# Patient Record
Sex: Female | Born: 1990 | Race: Black or African American | Hispanic: No | Marital: Single | Smoking: Never smoker
Health system: Southern US, Community
[De-identification: ages and names within clinical notes are randomized; demographics above are authoritative.]

## PROBLEM LIST (undated history)

## (undated) DIAGNOSIS — A749 Chlamydial infection, unspecified: Secondary | ICD-10-CM

---

## 2012-06-27 ENCOUNTER — Encounter (HOSPITAL_COMMUNITY): Payer: Self-pay

## 2012-06-27 ENCOUNTER — Inpatient Hospital Stay (HOSPITAL_COMMUNITY)
Admission: AD | Admit: 2012-06-27 | Discharge: 2012-06-27 | Disposition: A | Discharge status: Home / Self Care | Payer: BC Managed Care – PPO | Source: Ambulatory Visit | Attending: Obstetrics & Gynecology | Admitting: Obstetrics & Gynecology

## 2012-06-27 ENCOUNTER — Inpatient Hospital Stay (HOSPITAL_COMMUNITY): Payer: BC Managed Care – PPO

## 2012-06-27 DIAGNOSIS — N938 Other specified abnormal uterine and vaginal bleeding: Secondary | ICD-10-CM | POA: Insufficient documentation

## 2012-06-27 DIAGNOSIS — N939 Abnormal uterine and vaginal bleeding, unspecified: Secondary | ICD-10-CM

## 2012-06-27 DIAGNOSIS — R109 Unspecified abdominal pain: Secondary | ICD-10-CM | POA: Insufficient documentation

## 2012-06-27 DIAGNOSIS — N898 Other specified noninflammatory disorders of vagina: Secondary | ICD-10-CM

## 2012-06-27 DIAGNOSIS — N949 Unspecified condition associated with female genital organs and menstrual cycle: Secondary | ICD-10-CM | POA: Insufficient documentation

## 2012-06-27 HISTORY — DX: Chlamydial infection, unspecified: A74.9

## 2012-06-27 LAB — URINALYSIS, ROUTINE W REFLEX MICROSCOPIC
Bilirubin Urine: NEGATIVE
Nitrite: NEGATIVE
Specific Gravity, Urine: 1.02 (ref 1.005–1.030)
Urobilinogen, UA: 0.2 mg/dL (ref 0.0–1.0)
pH: 8 (ref 5.0–8.0)

## 2012-06-27 LAB — POCT PREGNANCY, URINE: Preg Test, Ur: NEGATIVE

## 2012-06-27 LAB — WET PREP, GENITAL
Trich, Wet Prep: NONE SEEN
Yeast Wet Prep HPF POC: NONE SEEN

## 2012-06-27 LAB — CBC WITH DIFFERENTIAL/PLATELET
Basophils Absolute: 0 10*3/uL (ref 0.0–0.1)
Basophils Relative: 0 % (ref 0–1)
Eosinophils Absolute: 0.2 10*3/uL (ref 0.0–0.7)
Eosinophils Relative: 3 % (ref 0–5)
HCT: 37 % (ref 36.0–46.0)
Lymphocytes Relative: 31 % (ref 12–46)
MCHC: 31.9 g/dL (ref 30.0–36.0)
MCV: 87.9 fL (ref 78.0–100.0)
Monocytes Absolute: 0.5 10*3/uL (ref 0.1–1.0)
Platelets: 185 10*3/uL (ref 150–400)
RDW: 12.6 % (ref 11.5–15.5)
WBC: 7.5 10*3/uL (ref 4.0–10.5)

## 2012-06-27 LAB — URINE MICROSCOPIC-ADD ON

## 2012-06-27 MED ORDER — NORGESTIMATE-ETH ESTRADIOL 0.25-35 MG-MCG PO TABS: 1.0000 | ORAL_TABLET | Freq: Every day | ORAL | Status: AC

## 2012-06-27 NOTE — MAU Note (Signed)
Patient is in with c/o radiating abdominal pain that started yesterday, she states that she started having light spotting after her PE class. She states that the bleeding is scant. She c/o heartburn and nausea. Her lmp 10/23. She states that it was not a normal cycle.

## 2012-06-27 NOTE — MAU Provider Note (Signed)
History     CSN: [id number]  Arrival date and time: 06/27/12 1556   None     Chief Complaint  Patient presents with  . Abdominal Pain  . Vaginal Bleeding   HPI Valerie Doyle is a 21 y.o. female who presents to MAU with vaginal bleeding. The bleeding started yesterday as spotting and has increased today.  Associated symptoms include abdominal pain. She denies fever or chills. Last sexual intercourse a few days ago. Current sex partner x 4 years, no birth control. Unprotected sex. Hx of Chlamydia 2 years ago.LMP 05/29/12. Has been having 2 periods a month for the past few months. Last pap smear less than one year with her GYN at home and was normal. 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  Patient concerned that she may have fibroids because her mother had to have a hysterectomy due to fibroids.  OB History    Grav Para Term Preterm Abortions TAB SAB Ect Mult Living   1               Past Medical History  Diagnosis Date  . Chlamydia     History reviewed. No pertinent past surgical history.  History reviewed. No pertinent family history.  History  Substance Use Topics  . Smoking status: Never Smoker   . Smokeless tobacco: Not on file  . Alcohol Use: No    Allergies: No Known Allergies  No prescriptions prior to admission    ROS: as stated in HPI Blood pressure 113/68, pulse 68, temperature 98.7 F (37.1 C), temperature source Oral, resp. rate 16, height 5\' 3"  (1.6 m), weight 123 lb 8 oz (56.019 kg), last menstrual period 05/29/2012.  Physical Exam  Nursing note and vitals reviewed. Constitutional: She is oriented to person, place, and time. She appears well-developed and well-nourished. No distress.  HENT:  Head: Normocephalic and atraumatic.  Eyes: EOM are normal.  Neck: Neck supple.  Cardiovascular: Normal rate.   Respiratory: Effort normal.  GI: Soft. There is no tenderness.  Genitourinary:       External genitalia without lesions.Moderate blood vaginal  vault. Positive CMT, mild adnexal tenderness. Uterus without palpable enlargement.  Musculoskeletal: Normal range of motion.  Neurological: She is alert and oriented to person, place, and time.  Skin: Skin is warm and dry.  Psychiatric: She has a normal mood and affect. Her behavior is normal. Judgment and thought content normal.   Assessment: 21 y.o. female with abnormal vaginal bleeding  Plan:  OC's and follow up in GYN Clinic     MAU Course  Procedures US Transvaginal Non-ob  06/27/2012  *RADIOLOGY REPORT*  Clinical Data: Pelvic pain, vaginal bleeding  TRANSABDOMINAL AND TRANSVAGINAL ULTRASOUND OF PELVIS Technique:  Both transabdominal and transvaginal ultrasound examinations of the pelvis were performed. Transabdominal technique was performed for global imaging of the pelvis including uterus, ovaries, adnexal regions, and pelvic cul-de-sac.  It was necessary to proceed with endovaginal exam following the transabdominal exam to visualize the endometrium and right ovary.  Comparison:  None  Findings:  Uterus: 7.2 cm length by 4.2 cm AP by 4.9 cm transverse.  Normal morphology without mass.  Endometrium: 4 mm thick. Endometrial complex is heterogeneous in appearance, irregular, cannot exclude blood within endometrial canal.  Right ovary:  3.30 1.0 x 2.2 cm.  Normal morphology without mass.  Left ovary: 3.3 x 1.8 x 2.1 cm.  Normal morphology without mass.  Other findings: Small amount nonspecific free pelvic fluid in right adnexa.  No  adnexal masses.  Patient was bleeding at time of exam.  IMPRESSION: Suspect blood within the endometrial canal. Small amount of nonspecific free pelvic fluid. Otherwise negative exam.   Original Report Authenticated By: Ulyses Southward, M.D.    US Pelvis Complete  06/27/2012  *RADIOLOGY REPORT*  Clinical Data: Pelvic pain, vaginal bleeding  TRANSABDOMINAL AND TRANSVAGINAL ULTRASOUND OF PELVIS Technique:  Both transabdominal and transvaginal ultrasound examinations of the  pelvis were performed. Transabdominal technique was performed for global imaging of the pelvis including uterus, ovaries, adnexal regions, and pelvic cul-de-sac.  It was necessary to proceed with endovaginal exam following the transabdominal exam to visualize the endometrium and right ovary.  Comparison:  None  Findings:  Uterus: 7.2 cm length by 4.2 cm AP by 4.9 cm transverse.  Normal morphology without mass.  Endometrium: 4 mm thick. Endometrial complex is heterogeneous in appearance, irregular, cannot exclude blood within endometrial canal.  Right ovary:  3.30 1.0 x 2.2 cm.  Normal morphology without mass.  Left ovary: 3.3 x 1.8 x 2.1 cm.  Normal morphology without mass.  Other findings: Small amount nonspecific free pelvic fluid in right adnexa.  No adnexal masses.  Patient was bleeding at time of exam.  IMPRESSION: Suspect blood within the endometrial canal. Small amount of nonspecific free pelvic fluid. Otherwise negative exam.   Original Report Authenticated By: Ulyses Southward, M.D.     I have reviewed this patient's vital signs, nurses notes, appropriate labs and imaging. I have discussed findings with the patient and plan of care.   Medication List     As of 06/27/2012  6:04 PM    START taking these medications         norgestimate-ethinyl estradiol 0.25-35 MG-MCG tablet   Commonly known as: ORTHO-CYCLEN,SPRINTEC,PREVIFEM   Take 1 tablet by mouth daily.          Where to get your medications    These are the prescriptions that you need to pick up. We sent them to a specific pharmacy, so you will need to go there to get them.   [facility] - [place], [place] - [address]    [address] [place]  [address]    Phone: [phone]        norgestimate-ethinyl estradiol 0.25-35 MG-MCG tablet            Tramond Slinker, RN, FNP, [facility] 06/27/2012, 6:04 PM

## 2012-06-28 LAB — GC/CHLAMYDIA PROBE AMP, GENITAL: Chlamydia, DNA Probe: NEGATIVE

## 2012-07-07 NOTE — MAU Provider Note (Signed)
Attestation of Attending Supervision of Advanced Practitioner (CNM/NP): Evaluation and management procedures were performed by the Advanced Practitioner under my supervision and collaboration. I have reviewed the Advanced Practitioner's note and chart, and I agree with the management and plan.  Kemper Heupel H. 2:47 PM   

## 2012-07-22 ENCOUNTER — Encounter: Payer: BC Managed Care – PPO | Admitting: Obstetrics and Gynecology

## 2013-09-20 ENCOUNTER — Emergency Department (HOSPITAL_COMMUNITY)
Admission: EM | Admit: 2013-09-20 | Discharge: 2013-09-20 | Disposition: A | Discharge status: Home / Self Care | Payer: BC Managed Care – PPO | Source: Home / Self Care | Attending: Family Medicine | Admitting: Family Medicine

## 2013-09-20 ENCOUNTER — Encounter (HOSPITAL_COMMUNITY): Payer: Self-pay | Admitting: Emergency Medicine

## 2013-09-20 DIAGNOSIS — K5289 Other specified noninfective gastroenteritis and colitis: Secondary | ICD-10-CM

## 2013-09-20 DIAGNOSIS — K529 Noninfective gastroenteritis and colitis, unspecified: Secondary | ICD-10-CM

## 2013-09-20 LAB — POCT URINALYSIS DIP (DEVICE)
Bilirubin Urine: NEGATIVE
Glucose, UA: NEGATIVE mg/dL
Hgb urine dipstick: NEGATIVE
Ketones, ur: NEGATIVE mg/dL
LEUKOCYTES UA: NEGATIVE
NITRITE: NEGATIVE
PH: 6.5 (ref 5.0–8.0)
PROTEIN: NEGATIVE mg/dL
Specific Gravity, Urine: 1.025 (ref 1.005–1.030)
Urobilinogen, UA: 0.2 mg/dL (ref 0.0–1.0)

## 2013-09-20 LAB — POCT PREGNANCY, URINE: Preg Test, Ur: NEGATIVE

## 2013-09-20 MED ORDER — ONDANSETRON HCL 4 MG PO TABS: 4.0000 mg | ORAL_TABLET | Freq: Three times a day (TID) | ORAL | Status: AC | PRN

## 2013-09-20 MED ORDER — LOPERAMIDE HCL 2 MG PO CAPS: 2.0000 mg | ORAL_CAPSULE | Freq: Four times a day (QID) | ORAL | Status: AC | PRN

## 2013-09-20 NOTE — ED Provider Notes (Signed)
Medical screening examination/treatment/procedure(s) were performed by resident physician or non-physician practitioner and as supervising physician I was immediately available for consultation/collaboration.   Tene Gato DOUGLAS MD.   Veena Sturgess D Naylin Burkle, MD 09/20/13 1145 

## 2013-09-20 NOTE — ED Provider Notes (Signed)
CSN: [id number]     Arrival date & time 09/20/13  [id number] History   First MD Initiated Contact with Patient 09/20/13 [id number]     No chief complaint on file.    (Consider location/radiation/quality/duration/timing/severity/associated sxs/prior Treatment) HPI Comments: Patient reports 4-5 days of nausea, diarrhea, sneezing and rhinorrhea. 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 Denies fever or rash. Non-bloody diarrhea without fever. No GU sx.  Is able to tolerate clear fluids by mouth.   Patient is a 23 y.o. female presenting with diarrhea. The history is provided by the patient.  Diarrhea   Past Medical History  Diagnosis Date  . Chlamydia    No past surgical history on file. No family history on file. History  Substance Use Topics  . Smoking status: Never Smoker   . Smokeless tobacco: Not on file  . Alcohol Use: No   OB History   Grav Para Term Preterm Abortions TAB SAB Ect Mult Living   1              Review of Systems  Gastrointestinal: Positive for diarrhea.  All other systems reviewed and are negative.      Allergies  Review of patient's allergies indicates no known allergies.  Home Medications   Current Outpatient Rx  Name  Route  Sig  Dispense  Refill  . norgestimate-ethinyl estradiol (SPRINTEC 28) 0.25-35 MG-MCG tablet   Oral   Take 1 tablet by mouth daily.   1 Package   11    There were no vitals taken for this visit. Physical Exam  Nursing note and vitals reviewed. Constitutional: She appears well-developed and well-nourished. No distress.  HENT:  Head: Normocephalic and atraumatic.  Right Ear: External ear normal.  Left Ear: External ear normal.  Nose: Nose normal.  Mouth/Throat: Oropharynx is clear and moist.  Eyes: Conjunctivae are normal. No scleral icterus.  Cardiovascular: Normal rate, regular rhythm and normal heart sounds.     ED Course  Procedures (including critical  care time) Labs Review Labs Reviewed - No data to display Imaging Review No results found.    MDM   Final diagnoses:  None  UA normal. Likely a self-limited viral gastroenteritis. Zofran for nausea. Loperamide for diarrhea. Follow up at campus student health if no improvement.   Jess BartersJennifer Lee Bard College[employer]on, [place] 09/20/13 1115

## 2013-09-20 NOTE — ED Notes (Signed)
Pt c/o upset stomach and diarrhea onset Tuesday Also having runny nose and sneezing Denies f/v/n Alert w/no signs of acute distress.

## 2013-09-20 NOTE — Discharge Instructions (Signed)
Please make sure you drink at least 4-8 ounces of clear fluids after every loose or diarrhea stool. Please use medications as directed for nausea and diarrhea. You can expect that your symptoms will resolve over the next few days. If you develop fever, bloody stools or are unable to keep clear liquid down, please have yourself re-evaluated.

## 2014-05-02 IMAGING — US US TRANSVAGINAL NON-OB
1 series · 14 of 25 positions shown · non-contrast
Comparison: None

CLINICAL DATA: Pelvic pain, vaginal bleeding



[Series 1: us pelvis complete · 14 of 98 slices shown]
[im 1/98]
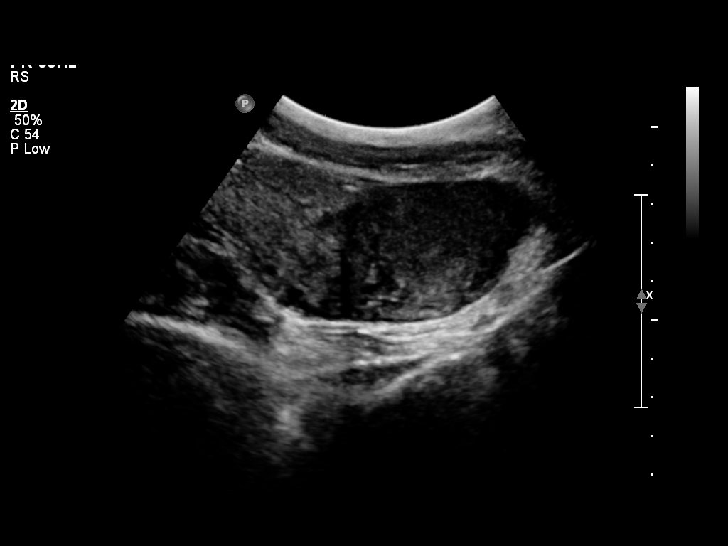
[im 9/98]
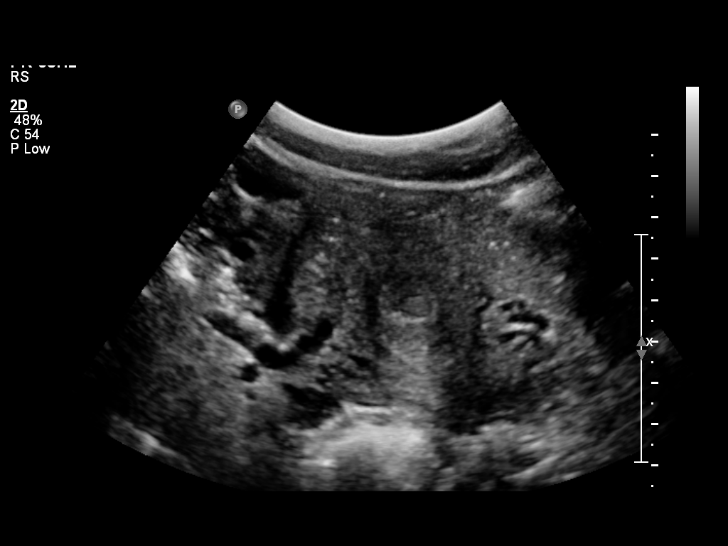
[im 17/98]
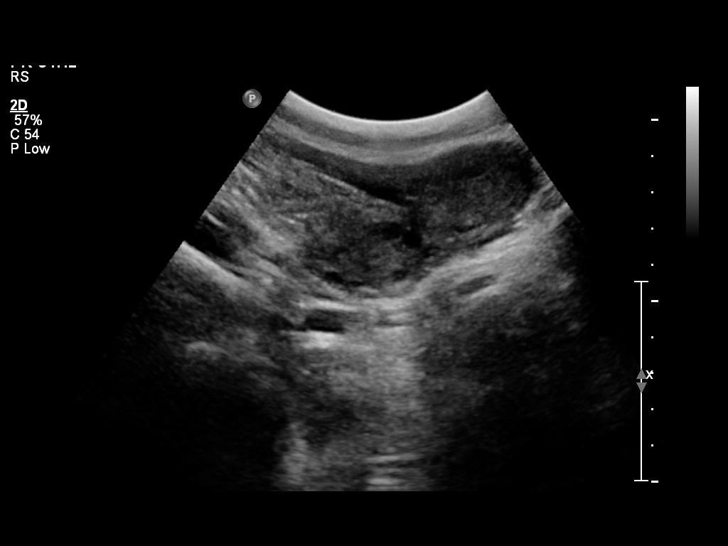
[im 25/98]
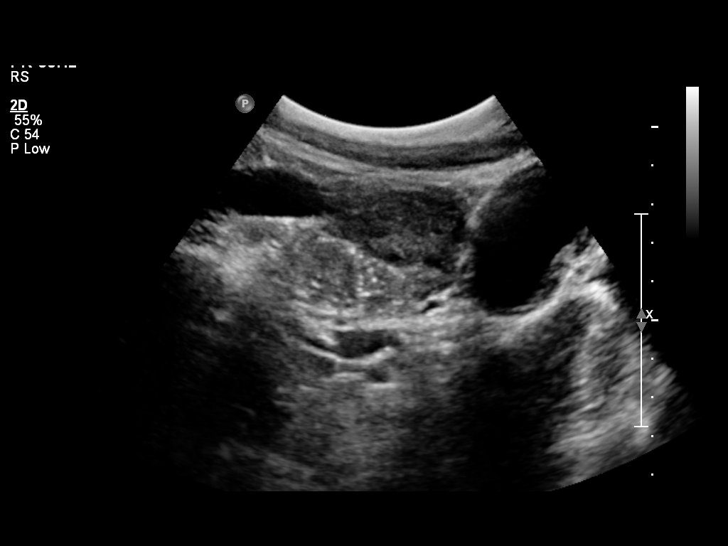
[im 33/98]
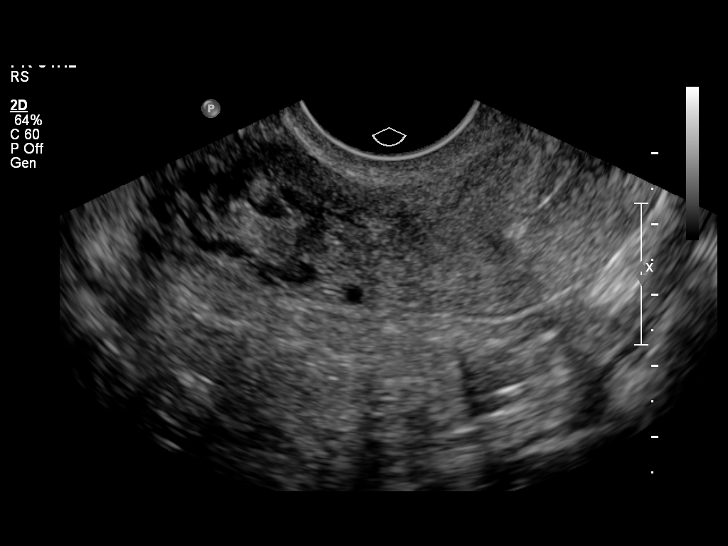
[im 37/98]
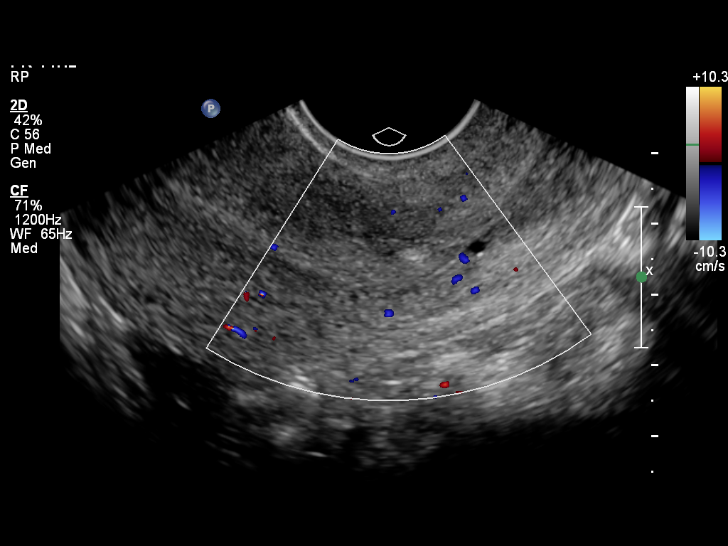
[im 45/98]
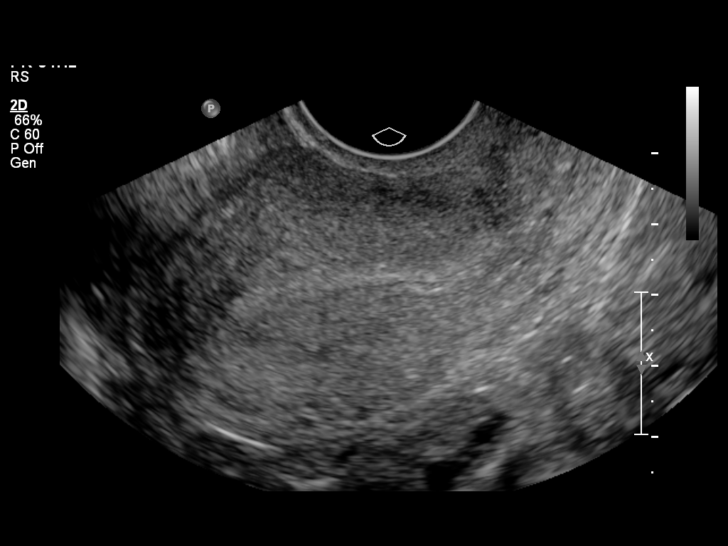
[im 53/98]
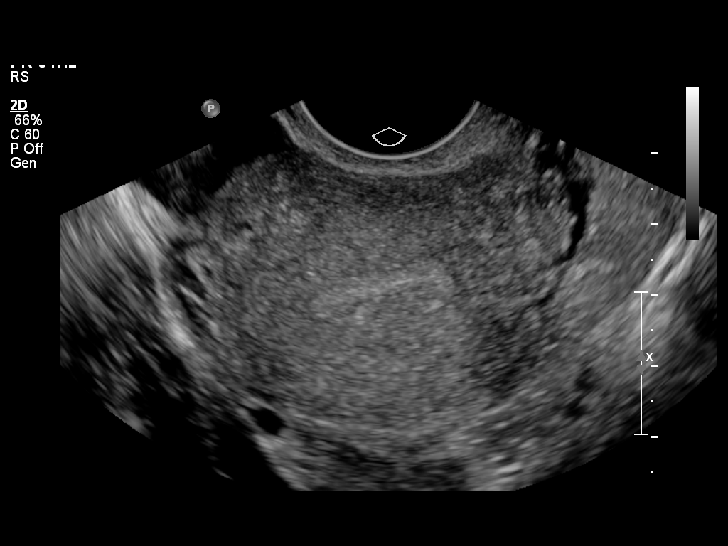
[im 61/98]
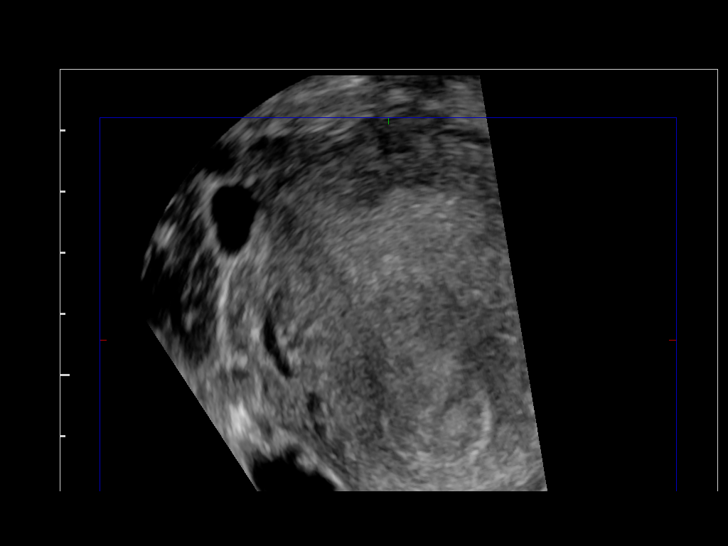
[im 65/98]
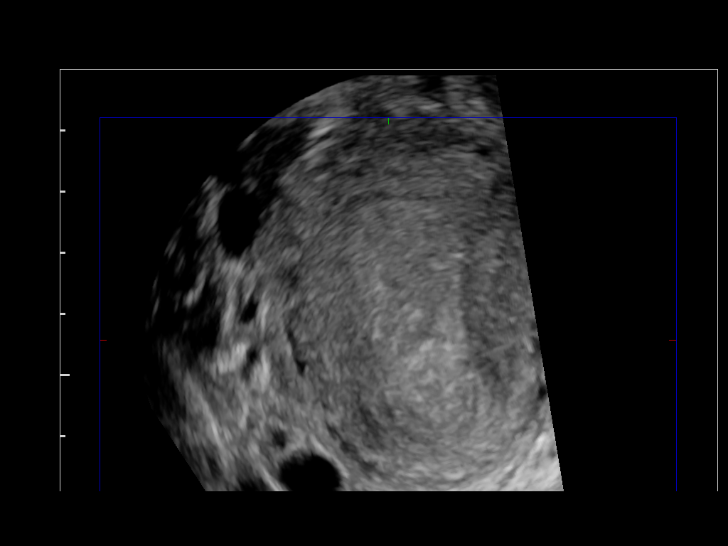
[im 73/98]
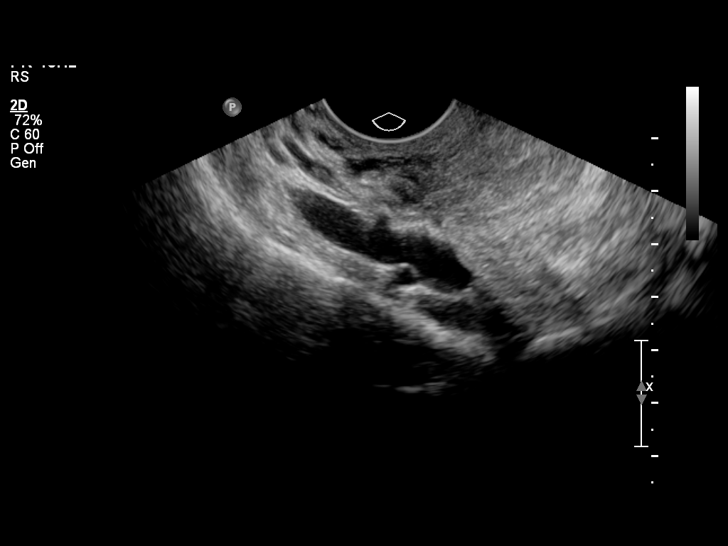
[im 81/98]
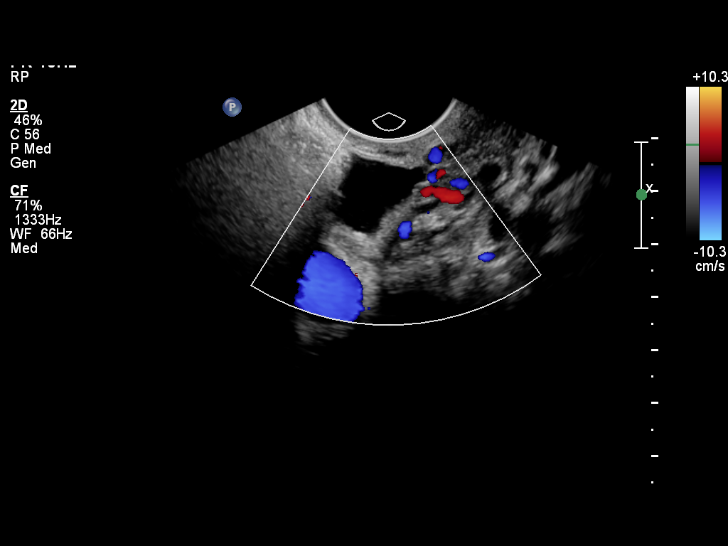
[im 89/98]
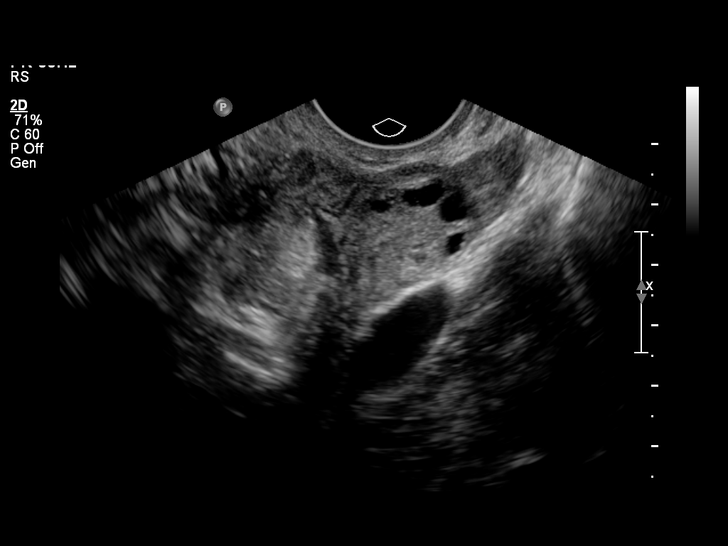
[im 98/98]
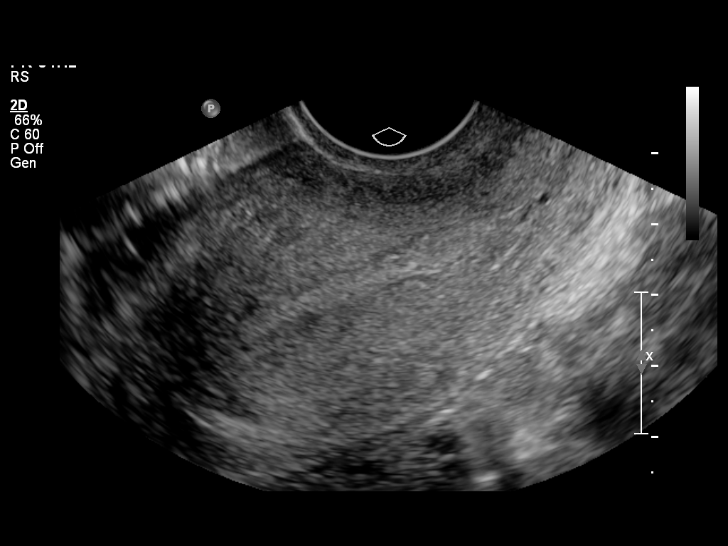

[14 of 25 positions shown; findings below may reference images not displayed]

FINDINGS: Uterus: 7.2 cm length by 4.2 cm AP by 4.9 cm transverse.  Normal
morphology without mass.

Endometrium: 4 mm thick. Endometrial complex is heterogeneous in
appearance, irregular, cannot exclude blood within endometrial
canal.

Right ovary:  3.30 1.0 x 2.2 cm.  Normal morphology without mass.

Left ovary: 3.3 x 1.8 x 2.1 cm.  Normal morphology without mass.

Other findings: Small amount nonspecific free pelvic fluid in right
adnexa.  No adnexal masses.

Patient was bleeding at time of exam.
IMPRESSION: Suspect blood within the endometrial canal.
Small amount of nonspecific free pelvic fluid.
Otherwise negative exam..

## 2014-06-08 ENCOUNTER — Encounter (HOSPITAL_COMMUNITY): Payer: Self-pay | Admitting: Emergency Medicine
